# Patient Record
Sex: Male | Born: 1990 | Race: Black or African American | Hispanic: No | Marital: Single | Smoking: Current every day smoker
Health system: Southern US, Community
[De-identification: ages and names within clinical notes are randomized; demographics above are authoritative.]

---
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
## 2012-12-09 ENCOUNTER — Emergency Department (HOSPITAL_COMMUNITY): Payer: Medicaid Other

## 2012-12-09 ENCOUNTER — Encounter (HOSPITAL_COMMUNITY): Payer: Self-pay | Admitting: *Deleted

## 2012-12-09 ENCOUNTER — Telehealth (HOSPITAL_COMMUNITY): Payer: Self-pay | Admitting: Emergency Medicine

## 2012-12-09 ENCOUNTER — Emergency Department (HOSPITAL_COMMUNITY)
Admission: EM | Admit: 2012-12-09 | Discharge: 2012-12-09 | Disposition: A | Discharge status: Home / Self Care | Payer: Medicaid Other | Attending: Emergency Medicine | Admitting: Emergency Medicine

## 2012-12-09 DIAGNOSIS — Y929 Unspecified place or not applicable: Secondary | ICD-10-CM | POA: Insufficient documentation

## 2012-12-09 DIAGNOSIS — S41109A Unspecified open wound of unspecified upper arm, initial encounter: Secondary | ICD-10-CM | POA: Insufficient documentation

## 2012-12-09 DIAGNOSIS — W3400XA Accidental discharge from unspecified firearms or gun, initial encounter: Secondary | ICD-10-CM | POA: Insufficient documentation

## 2012-12-09 DIAGNOSIS — S41132A Puncture wound without foreign body of left upper arm, initial encounter: Secondary | ICD-10-CM

## 2012-12-09 DIAGNOSIS — Y939 Activity, unspecified: Secondary | ICD-10-CM | POA: Insufficient documentation

## 2012-12-09 LAB — CBC WITH DIFFERENTIAL/PLATELET
Basophils Absolute: 0 10*3/uL (ref 0.0–0.1)
Eosinophils Absolute: 0.2 10*3/uL (ref 0.0–0.7)
Eosinophils Relative: 4 % (ref 0–5)
Lymphs Abs: 2 10*3/uL (ref 0.7–4.0)
Monocytes Absolute: 0.4 10*3/uL (ref 0.1–1.0)
Monocytes Relative: 7 % (ref 3–12)
Neutro Abs: 3 10*3/uL (ref 1.7–7.7)
Neutrophils Relative %: 53 % (ref 43–77)
RBC: 4.64 MIL/uL (ref 4.22–5.81)
RDW: 13.1 % (ref 11.5–15.5)

## 2012-12-09 LAB — POCT I-STAT, CHEM 8
BUN: 8 mg/dL (ref 6–23)
Creatinine, Ser: 1.1 mg/dL (ref 0.50–1.35)
Glucose, Bld: 90 mg/dL (ref 70–99)
Hemoglobin: 15.3 g/dL (ref 13.0–17.0)
Potassium: 3.6 mEq/L (ref 3.5–5.1)
Sodium: 140 mEq/L (ref 135–145)

## 2012-12-09 MED ORDER — FENTANYL CITRATE 0.05 MG/ML IJ SOLN
100.0000 ug | Freq: Once | INTRAMUSCULAR | Status: AC
  Administered 2012-12-09: 100 ug via INTRAVENOUS
  Filled 2012-12-09: qty 2

## 2012-12-09 MED ORDER — CEPHALEXIN 500 MG PO CAPS: ORAL_CAPSULE | ORAL | Status: DC

## 2012-12-09 MED ORDER — HYDROCODONE-ACETAMINOPHEN 5-325 MG PO TABS: 2.0000 | ORAL_TABLET | Freq: Four times a day (QID) | ORAL | Status: DC | PRN

## 2012-12-09 MED ORDER — SODIUM CHLORIDE 0.9 % IV SOLN
INTRAVENOUS | Status: DC
  Administered 2012-12-09: 03:00:00 via INTRAVENOUS

## 2012-12-09 MED ORDER — CEFAZOLIN SODIUM 1-5 GM-% IV SOLN
1.0000 g | Freq: Once | INTRAVENOUS | Status: AC
  Administered 2012-12-09: 1 g via INTRAVENOUS
  Filled 2012-12-09: qty 50

## 2012-12-09 MED ORDER — SODIUM CHLORIDE 0.9 % IV BOLUS (SEPSIS)
500.0000 mL | Freq: Once | INTRAVENOUS | Status: AC
  Administered 2012-12-09: 500 mL via INTRAVENOUS
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
## 2012-12-10 ENCOUNTER — Telehealth (HOSPITAL_COMMUNITY): Payer: Self-pay | Admitting: Emergency Medicine

## 2012-12-18 ENCOUNTER — Encounter (INDEPENDENT_AMBULATORY_CARE_PROVIDER_SITE_OTHER): Payer: Self-pay

## 2012-12-18 ENCOUNTER — Ambulatory Visit (INDEPENDENT_AMBULATORY_CARE_PROVIDER_SITE_OTHER): Payer: Medicaid Other | Admitting: Internal Medicine

## 2012-12-18 VITALS — BP 120/80 | HR 76 | Resp 14 | Ht 67.0 in | Wt 159.6 lb

## 2012-12-18 DIAGNOSIS — M795 Residual foreign body in soft tissue: Secondary | ICD-10-CM

## 2012-12-18 DIAGNOSIS — S41132D Puncture wound without foreign body of left upper arm, subsequent encounter: Secondary | ICD-10-CM

## 2012-12-18 NOTE — Patient Instructions (Addendum)
Change dressing with antibacterial cream and dry dressing atleast twice a day Follow up next Friday for suture removal
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
## 2012-12-25 ENCOUNTER — Ambulatory Visit (INDEPENDENT_AMBULATORY_CARE_PROVIDER_SITE_OTHER): Payer: Medicaid Other | Admitting: General Surgery

## 2012-12-25 ENCOUNTER — Encounter (INDEPENDENT_AMBULATORY_CARE_PROVIDER_SITE_OTHER): Payer: Self-pay

## 2012-12-25 VITALS — BP 122/80 | HR 82 | Temp 98.2°F | Resp 15 | Ht 64.0 in | Wt 161.0 lb

## 2012-12-25 DIAGNOSIS — L089 Local infection of the skin and subcutaneous tissue, unspecified: Secondary | ICD-10-CM

## 2012-12-25 DIAGNOSIS — IMO0002 Reserved for concepts with insufficient information to code with codable children: Secondary | ICD-10-CM
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
## 2015-03-01 ENCOUNTER — Emergency Department (HOSPITAL_COMMUNITY)
Admission: EM | Admit: 2015-03-01 | Discharge: 2015-03-01 | Disposition: A | Discharge status: Home / Self Care | Payer: Medicaid Other | Attending: Emergency Medicine | Admitting: Emergency Medicine

## 2015-03-01 ENCOUNTER — Encounter (HOSPITAL_COMMUNITY): Payer: Self-pay | Admitting: Emergency Medicine

## 2015-03-01 DIAGNOSIS — J029 Acute pharyngitis, unspecified: Secondary | ICD-10-CM | POA: Insufficient documentation

## 2015-03-01 DIAGNOSIS — Z720 Tobacco use: Secondary | ICD-10-CM | POA: Insufficient documentation

## 2015-03-01 LAB — RAPID STREP SCREEN (MED CTR MEBANE ONLY): Streptococcus, Group A Screen (Direct): NEGATIVE

## 2015-03-01 MED ORDER — DEXAMETHASONE SODIUM PHOSPHATE 10 MG/ML IJ SOLN
10.0000 mg | Freq: Once | INTRAMUSCULAR | Status: AC
  Administered 2015-03-01: 10 mg via INTRAMUSCULAR
  Filled 2015-03-01: qty 1

## 2015-03-01 NOTE — Discharge Instructions (Signed)

## 2015-03-01 NOTE — ED Provider Notes (Signed)
CSN: [id number]     Arrival date & time 03/01/15  2203 History  By signing my name below, I, Lyndel SafeKaitlyn Shelton, attest that this documentation has been prepared under the direction and in the presence of Teressa LowerVrinda Samera Macy, NP. Electronically Signed: Lyndel SafeKaitlyn Shelton, ED Scribe. 03/01/2015. 10:34 PM.   Chief Complaint  Patient presents with  . Sore Throat   The history is provided by the patient. No language interpreter was used.   HPI Comments: Earl Reynolds is a 24 y.o. male, with no chronic medical conditions, who presents to the Emergency Department complaining of a progressively worsening, constant sore throat onset 2 days ago that is has been unrelieved by cough drops and OTC medication. He denies exposure to any chemicals, rhinorrhea or fevers; however, pt is febrile on triage vitals at 100.59F.   History reviewed. No pertinent past medical history. History reviewed. No pertinent past surgical history. No family history on file. Social History  Substance Use Topics  . Smoking status: Current Every Day Smoker -- 0.25 packs/day  . Smokeless tobacco: Never Used  . Alcohol Use: No    Review of Systems  Constitutional: Negative for fever.  HENT: Positive for sore throat. Negative for rhinorrhea.   All other systems reviewed and are negative.  Allergies  Review of patient's allergies indicates no known allergies.  Home Medications   Prior to Admission medications   Medication Sig Start Date End Date Taking? Authorizing Provider  cephALEXin (KEFLEX) 500 MG capsule 2 caps po bid x 7 days 12/09/12   Wayland SalinasJohn Bednar, MD  HYDROcodone-acetaminophen (NORCO) 5-325 MG per tablet Take 2 tablets by mouth every 6 (six) hours as needed for pain. 12/09/12   Wayland SalinasJohn Bednar, MD   BP 134/84 mmHg  Pulse 92  Temp(Src) 100.9 F (38.3 C) (Oral)  Resp 16  SpO2 98% Physical Exam  Constitutional: He is oriented to person, place, and time. He appears well-developed and well-nourished. No distress.  HENT:  Head:  Normocephalic.  Right Ear: External ear normal.  Left Ear: External ear normal.  Mouth/Throat: Oropharyngeal exudate and posterior oropharyngeal erythema present.  Eyes: Conjunctivae are normal.  Neck: Normal range of motion. Neck supple.  Cardiovascular: Normal rate.   Pulmonary/Chest: Effort normal. No respiratory distress.  Abdominal: Soft. Bowel sounds are normal. There is no tenderness.  Musculoskeletal: Normal range of motion.  Neurological: He is alert and oriented to person, place, and time. Coordination normal.  Skin: Skin is warm.  Psychiatric: He has a normal mood and affect. His behavior is normal.  Nursing note and vitals reviewed.   ED Course  Procedures  DIAGNOSTIC STUDIES: Oxygen Saturation is 98% on RA, normal by my interpretation.    COORDINATION OF CARE: 10:33 PM Discussed treatment plan with pt at bedside and pt agreed to plan.   Labs Review Labs Reviewed  RAPID STREP SCREEN (NOT AT [facility])  CULTURE, GROUP A STREP   I have personally reviewed and evaluated these lab results as part of my medical decision-making.   MDM   Final diagnoses:  Pharyngitis   Strep negative. Culture sent. No pta. Discussed symptomatic treatment at home  I personally performed the services described in this documentation, which was scribed in my presence. The recorded information has been reviewed and is accurate.    Teressa LowerVrinda Oleta Gunnoe, NP 03/01/15 2322  Cathren LaineKevin Steinl, MD 03/07/15 1047

## 2015-03-01 NOTE — ED Notes (Signed)
Pt. reports sore throat with swelling onset last week , denies cough / no fever or chills.

## 2015-03-04 LAB — CULTURE, GROUP A STREP

## 2016-04-28 ENCOUNTER — Encounter (HOSPITAL_COMMUNITY): Payer: Self-pay

## 2016-04-28 ENCOUNTER — Emergency Department (HOSPITAL_COMMUNITY)
Admission: EM | Admit: 2016-04-28 | Discharge: 2016-04-28 | Disposition: A | Discharge status: Home Health | Payer: Medicaid Other | Attending: Emergency Medicine | Admitting: Emergency Medicine

## 2016-04-28 DIAGNOSIS — Y9389 Activity, other specified: Secondary | ICD-10-CM | POA: Insufficient documentation

## 2016-04-28 DIAGNOSIS — F17200 Nicotine dependence, unspecified, uncomplicated: Secondary | ICD-10-CM | POA: Insufficient documentation

## 2016-04-28 DIAGNOSIS — Y929 Unspecified place or not applicable: Secondary | ICD-10-CM | POA: Insufficient documentation

## 2016-04-28 DIAGNOSIS — S41112A Laceration without foreign body of left upper arm, initial encounter: Secondary | ICD-10-CM

## 2016-04-28 DIAGNOSIS — Y999 Unspecified external cause status: Secondary | ICD-10-CM | POA: Insufficient documentation

## 2016-04-28 MED ORDER — LIDOCAINE HCL (PF) 1 % IJ SOLN
5.0000 mL | Freq: Once | INTRAMUSCULAR | Status: AC
  Administered 2016-04-28: 5 mL
  Filled 2016-04-28: qty 5

## 2016-04-28 MED ORDER — CEPHALEXIN 500 MG PO CAPS: 500.0000 mg | ORAL_CAPSULE | Freq: Four times a day (QID) | ORAL | 0 refills | Status: AC

## 2016-04-28 MED ORDER — CEPHALEXIN 250 MG PO CAPS
500.0000 mg | ORAL_CAPSULE | Freq: Once | ORAL | Status: AC
  Administered 2016-04-28: 500 mg via ORAL
  Filled 2016-04-28: qty 2

## 2016-04-28 MED ORDER — IBUPROFEN 800 MG PO TABS
800.0000 mg | ORAL_TABLET | Freq: Once | ORAL | Status: AC
  Administered 2016-04-28: 800 mg via ORAL
  Filled 2016-04-28: qty 1

## 2016-04-28 MED ORDER — NAPROXEN 500 MG PO TABS: 500.0000 mg | ORAL_TABLET | Freq: Two times a day (BID) | ORAL | 0 refills | Status: DC

## 2016-04-28 NOTE — Discharge Instructions (Signed)
Take the medication as directed follow up in 7 to 10 days for suture removal. Return here sooner for any problems.
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
## 2016-11-30 ENCOUNTER — Encounter (HOSPITAL_COMMUNITY): Payer: Self-pay | Admitting: Emergency Medicine

## 2016-11-30 ENCOUNTER — Emergency Department (HOSPITAL_COMMUNITY)
Admission: EM | Admit: 2016-11-30 | Discharge: 2016-11-30 | Disposition: A | Discharge status: Home / Self Care | Payer: Medicaid Other | Attending: Emergency Medicine | Admitting: Emergency Medicine

## 2016-11-30 DIAGNOSIS — J029 Acute pharyngitis, unspecified: Secondary | ICD-10-CM

## 2016-11-30 DIAGNOSIS — J36 Peritonsillar abscess: Secondary | ICD-10-CM | POA: Insufficient documentation

## 2016-11-30 DIAGNOSIS — F17200 Nicotine dependence, unspecified, uncomplicated: Secondary | ICD-10-CM | POA: Insufficient documentation

## 2016-11-30 LAB — RAPID STREP SCREEN (MED CTR MEBANE ONLY): STREPTOCOCCUS, GROUP A SCREEN (DIRECT): NEGATIVE

## 2016-11-30 MED ORDER — HYDROCODONE-ACETAMINOPHEN 7.5-325 MG/15ML PO SOLN: 10.0000 mL | Freq: Four times a day (QID) | ORAL | 0 refills | Status: AC | PRN

## 2016-11-30 MED ORDER — CLINDAMYCIN HCL 150 MG PO CAPS: 450.0000 mg | ORAL_CAPSULE | Freq: Three times a day (TID) | ORAL | 0 refills | Status: AC

## 2016-11-30 MED ORDER — HYDROCODONE-ACETAMINOPHEN 7.5-325 MG/15ML PO SOLN
10.0000 mL | Freq: Once | ORAL | Status: AC
  Administered 2016-11-30: 10 mL via ORAL
  Filled 2016-11-30: qty 15

## 2016-11-30 MED ORDER — PREDNISONE 20 MG PO TABS: 40.0000 mg | ORAL_TABLET | Freq: Every day | ORAL | 0 refills | Status: AC

## 2016-11-30 MED ORDER — CLINDAMYCIN HCL 150 MG PO CAPS
450.0000 mg | ORAL_CAPSULE | Freq: Once | ORAL | Status: AC
  Administered 2016-11-30: 450 mg via ORAL
  Filled 2016-11-30: qty 3

## 2016-11-30 MED ORDER — PREDNISONE 20 MG PO TABS
60.0000 mg | ORAL_TABLET | Freq: Once | ORAL | Status: AC
  Administered 2016-11-30: 60 mg via ORAL
  Filled 2016-11-30: qty 3

## 2016-11-30 NOTE — ED Notes (Signed)
ED Provider at bedside. 

## 2016-11-30 NOTE — ED Triage Notes (Signed)
Sore throat since last night.  

## 2016-11-30 NOTE — ED Provider Notes (Signed)
[facility]-EMERGENCY DEPT Provider Note   CSN: [id number] Arrival date & time: 11/30/16  1302   By signing my name below, I, Earl Reynolds, attest that this documentation has been prepared under the direction and in the presence of Earl HartKelly Wafa Martes, PA-C. Electronically signed, Earl Reynolds, ED Scribe. 11/30/16. 2:13 PM.  History   Chief Complaint Chief Complaint  Patient presents with  . Sore Throat   The history is provided by the patient and medical records. No language interpreter was used.    Earl Reynolds is a [age] y.o. male presenting to the Emergency Department concerning sudden R sided sore throat onset last night. Associated difficulty swallowing, subjective fever, fatigue, throat swelling sensation, R sided adenopathy and difficulty tolerating secretions. 6/10, constant soreness described. He notes sick contacts recently. Pt denies drooling, stating he "spits it out", referring to his oral secretions. No congestion, cough, rhinorrhea, postnasal drainage, headache, ear pain, eye pain or pain elsewhere. No other complaints at this time.   History reviewed. No pertinent past medical history.  There are no active problems to display for this patient.   History reviewed. No pertinent surgical history.     Home Medications    Prior to Admission medications   Medication Sig Start Date End Date Taking? Authorizing Provider  cephALEXin (KEFLEX) 500 MG capsule Take 1 capsule (500 mg total) by mouth 4 (four) times daily. 04/28/16   Earl Reynolds, Earl M, NP  naproxen (NAPROSYN) 500 MG tablet Take 1 tablet (500 mg total) by mouth 2 (two) times daily. 04/28/16   Earl Reynolds, Earl M, NP    Family History No family history on file.  Social History Social History  Substance Use Topics  . Smoking status: Current Every Day Smoker    Packs/day: 0.25  . Smokeless tobacco: Never Used  . Alcohol use No     Allergies   Patient has no known allergies.   Review of Systems Review of Systems    Constitutional: Positive for chills and fever. Negative for diaphoresis.  HENT: Positive for sore throat and trouble swallowing. Negative for congestion, drooling, ear pain, postnasal drip, rhinorrhea, sinus pain and sinus pressure.   Eyes: Negative for pain.  Cardiovascular: Negative for chest pain.  Gastrointestinal: Negative for abdominal pain, nausea and vomiting.  Musculoskeletal: Negative for arthralgias and myalgias.  Neurological: Negative for headaches.  Hematological: Positive for adenopathy.     Physical Exam Updated Vital Signs BP (!) 147/86   Temp 99.5 F (37.5 C) (Oral)   Resp 18   SpO2 98%   Physical Exam  Constitutional: He is oriented to person, place, and time. He appears well-developed and well-nourished.  uncomfortable appearing.  HENT:  Head: Normocephalic.  Mouth/Throat: Uvula is midline. Posterior oropharyngeal edema, posterior oropharyngeal erythema and tonsillar abscesses present.  Swelling and erythema to R oropharynx  Eyes: EOM are normal.  Neck: Normal range of motion.  Cardiovascular: Normal rate and regular rhythm.  Exam reveals no gallop and no friction rub.   No murmur heard. Pulmonary/Chest: Effort normal and breath sounds normal. No respiratory distress. He has no wheezes. He has no rales. He exhibits no tenderness.  Abdominal: He exhibits no distension.  Musculoskeletal: Normal range of motion.  Neurological: He is alert and oriented to person, place, and time.  Psychiatric: He has a normal mood and affect.  Nursing note and vitals reviewed.    ED Treatments / Results  DIAGNOSTIC STUDIES: Oxygen Saturation is 98% on RA, NL by my interpretation.    COORDINATION OF  CARE: 2:10 PM-Discussed next steps with pt. Pt verbalized understanding and is agreeable with the plan. Will order medications and consult attending.   Labs (all labs ordered are listed, but only abnormal results are displayed) Labs Reviewed  RAPID STREP SCREEN (NOT AT  [facility])    EKG  EKG Interpretation None       Radiology No results found.  Procedures Procedures (including critical care time)  Medications Ordered in ED Medications - No data to display   Initial Impression / Assessment and Plan / ED Course  I have reviewed the triage vital signs and the nursing notes.  Pertinent labs & imaging results that were available during my care of the patient were reviewed by me and considered in my medical decision making (see chart for details).  26 year old male presents with PTA. Vitals are normal. He is handling secretions. He has marked edema and it appears to be more enlarged on R than L. Shared visit with Dr. Rosalia Hammersay. Will tx with Clinda, steroids, pain medicine and gave strict return precautions. ENT f/u given.  Final Clinical Impressions(s) / ED Diagnoses   Final diagnoses:  Acute pharyngitis, unspecified etiology  Peritonsillar abscess    New Prescriptions New Prescriptions   No medications on file   I personally performed the services described in this documentation, which was scribed in my presence. The recorded information has been reviewed and is accurate.    Earl Reynolds, Earl Reynolds Marie, PA-C 11/30/16 1745    Earl Reynolds, Danielle, Reynolds 12/07/16 [phone]

## 2016-11-30 NOTE — Discharge Instructions (Signed)
Take antibiotic three times a day for a week Take steroid once a day Take pain medicine as needed. Do not drive or drink alcohol while taking this medicine Follow up with ENT Return if worsening

## 2016-12-02 LAB — CULTURE, GROUP A STREP (THRC)

## 2017-08-31 ENCOUNTER — Encounter (HOSPITAL_COMMUNITY): Payer: Self-pay

## 2017-08-31 ENCOUNTER — Emergency Department (HOSPITAL_COMMUNITY)
Admission: EM | Admit: 2017-08-31 | Discharge: 2017-08-31 | Disposition: A | Discharge status: Home / Self Care | Payer: Self-pay | Attending: Emergency Medicine | Admitting: Emergency Medicine

## 2017-08-31 ENCOUNTER — Other Ambulatory Visit: Payer: Self-pay

## 2017-08-31 DIAGNOSIS — Y929 Unspecified place or not applicable: Secondary | ICD-10-CM | POA: Insufficient documentation

## 2017-08-31 DIAGNOSIS — Y939 Activity, unspecified: Secondary | ICD-10-CM | POA: Insufficient documentation

## 2017-08-31 DIAGNOSIS — S51812A Laceration without foreign body of left forearm, initial encounter: Secondary | ICD-10-CM | POA: Insufficient documentation

## 2017-08-31 DIAGNOSIS — F17210 Nicotine dependence, cigarettes, uncomplicated: Secondary | ICD-10-CM | POA: Insufficient documentation

## 2017-08-31 DIAGNOSIS — Y999 Unspecified external cause status: Secondary | ICD-10-CM | POA: Insufficient documentation

## 2017-08-31 MED ORDER — LIDOCAINE-EPINEPHRINE (PF) 2 %-1:200000 IJ SOLN
10.0000 mL | Freq: Once | INTRAMUSCULAR | Status: AC
  Administered 2017-08-31: 10 mL
  Filled 2017-08-31: qty 20
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
## 2017-08-31 NOTE — ED Notes (Signed)
Laceration cleaned with saline, covered with nonstick guaze and wrapped while pt waiting on an EDP.

## 2017-08-31 NOTE — Discharge Instructions (Addendum)
Suture removal in 7 days.  Clean wond daily with soamp and water, apply antibiotic ointment, and keep covered.
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
## 2018-03-16 ENCOUNTER — Emergency Department (HOSPITAL_COMMUNITY)
Admission: EM | Admit: 2018-03-16 | Discharge: 2018-03-16 | Disposition: A | Discharge status: Home / Self Care | Payer: Self-pay | Attending: Emergency Medicine | Admitting: Emergency Medicine

## 2018-03-16 ENCOUNTER — Encounter (HOSPITAL_COMMUNITY): Payer: Self-pay

## 2018-03-16 ENCOUNTER — Other Ambulatory Visit: Payer: Self-pay

## 2018-03-16 DIAGNOSIS — F17210 Nicotine dependence, cigarettes, uncomplicated: Secondary | ICD-10-CM | POA: Insufficient documentation

## 2018-03-16 DIAGNOSIS — A084 Viral intestinal infection, unspecified: Secondary | ICD-10-CM | POA: Insufficient documentation

## 2018-03-16 MED ORDER — ONDANSETRON HCL 4 MG PO TABS: 4.0000 mg | ORAL_TABLET | Freq: Four times a day (QID) | ORAL | 0 refills | Status: AC

## 2018-03-16 MED ORDER — ONDANSETRON 4 MG PO TBDP
4.0000 mg | ORAL_TABLET | Freq: Once | ORAL | Status: AC
  Administered 2018-03-16: 4 mg via ORAL
  Filled 2018-03-16: qty 1

## 2018-03-16 MED ORDER — ONDANSETRON HCL 4 MG PO TABS: 4.0000 mg | ORAL_TABLET | Freq: Four times a day (QID) | ORAL | 0 refills | Status: DC

## 2018-03-16 NOTE — ED Notes (Signed)
Patient verbalizes understanding of discharge instructions. Opportunity for questioning and answers were provided. Armband removed by staff, pt discharged from ED.  

## 2018-03-16 NOTE — Discharge Instructions (Addendum)
Please read attached information. If you experience any new or worsening signs or symptoms please return to the emergency room for evaluation. Please follow-up with your primary care provider or specialist as discussed. Please use medication prescribed only as directed and discontinue taking if you have any concerning signs or symptoms.   °

## 2018-03-16 NOTE — ED Provider Notes (Signed)
[facility] EMERGENCY DEPARTMENT Provider Note   CSN: [id number] Arrival date & time: 03/16/18  1212    History   Chief Complaint Chief Complaint  Patient presents with  . Headache  . Nausea    HPI Earl Reynolds is a 27 y.o. male.  HPI   27 year old male presents today with complaints of nausea vomiting and headache.  Patient notes symptoms started yesterday on his way to work.  He notes a generalized throbbing headache with no associated neurological deficits or neck stiffness.  He notes several episodes of vomiting.  He has been tolerating p.o. but continues to be nauseous.  Patient denies any abdominal pain diarrhea.  Patient notes feeling hot but no objective fever at home.  He notes his mother is sick with similar symptoms.  He denies any abnormal exposures, denies any insect bites.  He notes taking Tylenol yesterday, no medications prior to arrival today.   History reviewed. No pertinent past medical history.  There are no active problems to display for this patient.   History reviewed. No pertinent surgical history.     Home Medications    Prior to Admission medications   Medication Sig Start Date End Date Taking? Authorizing Provider  cephALEXin (KEFLEX) 500 MG capsule Take 1 capsule (500 mg total) by mouth 4 (four) times daily. 04/28/16   Janne Napoleon, NP  clindamycin (CLEOCIN) 150 MG capsule Take 3 capsules (450 mg total) by mouth 3 (three) times daily. 11/30/16   Bethel Born, PA-C  HYDROcodone-acetaminophen (HYCET) 7.5-325 mg/15 ml solution Take 10 mLs by mouth 4 (four) times daily as needed for moderate pain. Patient not taking: Reported on 08/31/2017 11/30/16   Bethel Born, PA-C  ondansetron (ZOFRAN) 4 MG tablet Take 1 tablet (4 mg total) by mouth every 6 (six) hours. 03/16/18   Orton Capell, Tinnie Gens, PA-C  predniSONE (DELTASONE) 20 MG tablet Take 2 tablets (40 mg total) by mouth daily. 11/30/16   Bethel Born, PA-C    Family  History No family history on file.  Social History Social History   Tobacco Use  . Smoking status: Current Every Day Smoker    Packs/day: 0.25  . Smokeless tobacco: Never Used  Substance Use Topics  . Alcohol use: No  . Drug use: No     Allergies   Patient has no known allergies.   Review of Systems Review of Systems  All other systems reviewed and are negative.  Physical Exam Updated Vital Signs BP (!) 148/94 (BP Location: Right Arm)   Pulse 90   Temp 98.3 F (36.8 C) (Oral)   Resp 16   Ht 5\' 6"  (1.676 m)   Wt 86.2 kg   SpO2 99%   BMI 30.67 kg/m   Physical Exam  Constitutional: He is oriented to person, place, and time. He appears well-developed and well-nourished.  HENT:  Head: Normocephalic and atraumatic.  Oropharynx clear no swelling or edema  Eyes: Pupils are equal, round, and reactive to light. Conjunctivae are normal. Right eye exhibits no discharge. Left eye exhibits no discharge. No scleral icterus.  Neck: Normal range of motion. No JVD present. No tracheal deviation present.  Pulmonary/Chest: Effort normal. No stridor.  Abdominal:  Abdomen soft nontender  Neurological: He is alert and oriented to person, place, and time. Coordination normal.  Psychiatric: He has a normal mood and affect. His behavior is normal. Judgment and thought content normal.  Nursing note and vitals reviewed.  ED Treatments / Results  Labs (all labs ordered are listed, but only abnormal results are displayed) Labs Reviewed - No data to display  EKG None  Radiology No results found.  Procedures Procedures (including critical care time)  Medications Ordered in ED Medications  ondansetron (ZOFRAN-ODT) disintegrating tablet 4 mg (4 mg Oral Given 03/16/18 1250)     Initial Impression / Assessment and Plan / ED Course  I have reviewed the triage vital signs and the nursing notes.  Pertinent labs & imaging results that were available during my care of the patient  were reviewed by me and considered in my medical decision making (see chart for details).     Labs:   Imaging:  Consults:  Therapeutics: Zofran  Discharge Meds: Zofran  Assessment/Plan: 27 year old male presents today with likely viral illness.  He is well-appearing in no acute distress soft nontender abdomen.  He is afebrile.  Tolerating p.o. discharged with Zofran, strict return precautions.  Patient verbalized understanding and agreement to today's plan.   Final Clinical Impressions(s) / ED Diagnoses   Final diagnoses:  Viral gastroenteritis    ED Discharge Orders         Ordered    ondansetron (ZOFRAN) 4 MG tablet  Every 6 hours,   Status:  Discontinued     03/16/18 1320    ondansetron (ZOFRAN) 4 MG tablet  Every 6 hours     03/16/18 1320           Eyvonne Mechanic, PA-C 03/16/18 1320    Cathren Laine, MD 03/17/18 [phone]

## 2018-03-16 NOTE — ED Triage Notes (Signed)
Pt reports headache, nausea, vomiting x1 last night and sweating. Reports sick contacts

## 2019-05-30 ENCOUNTER — Encounter (HOSPITAL_COMMUNITY): Payer: Self-pay

## 2019-05-30 ENCOUNTER — Other Ambulatory Visit: Payer: Self-pay

## 2019-05-30 ENCOUNTER — Ambulatory Visit (HOSPITAL_COMMUNITY)
Admission: EM | Admit: 2019-05-30 | Discharge: 2019-05-30 | Disposition: A | Discharge status: Home / Self Care | Payer: Self-pay | Attending: Family Medicine | Admitting: Family Medicine

## 2019-05-30 ENCOUNTER — Ambulatory Visit (INDEPENDENT_AMBULATORY_CARE_PROVIDER_SITE_OTHER): Payer: Self-pay

## 2019-05-30 DIAGNOSIS — M25562 Pain in left knee: Secondary | ICD-10-CM

## 2019-05-30 MED ORDER — CYCLOBENZAPRINE HCL 10 MG PO TABS: 10.0000 mg | ORAL_TABLET | Freq: Two times a day (BID) | ORAL | 0 refills | Status: AC | PRN
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
## 2020-10-10 ENCOUNTER — Encounter (HOSPITAL_COMMUNITY): Payer: Self-pay | Admitting: Emergency Medicine

## 2020-10-10 ENCOUNTER — Other Ambulatory Visit: Payer: Self-pay

## 2020-10-10 ENCOUNTER — Ambulatory Visit (HOSPITAL_COMMUNITY)
Admission: EM | Admit: 2020-10-10 | Discharge: 2020-10-10 | Disposition: A | Discharge status: Home / Self Care | Payer: Self-pay

## 2020-10-10 DIAGNOSIS — S299XXA Unspecified injury of thorax, initial encounter: Secondary | ICD-10-CM

## 2020-10-10 DIAGNOSIS — W19XXXA Unspecified fall, initial encounter: Secondary | ICD-10-CM

## 2020-10-10 NOTE — Discharge Instructions (Signed)
You can take Tylenol and/or Ibuprofen as needed for pain relief.  You can continue to take the muscle relaxer that you got over-the-counter.    Take epsom salt baths or apply a warm compress a few times a day for comfort.    Brace your ribs when coughing or taking a deep breath.  Make sure that you are taking deep breaths multiple times a day.    Drink plenty of fluids, rest, and do gentle stretches/exercises.    Return or go to the Emergency Department if symptoms worsen or do not improve in the next few days.

## 2020-10-10 NOTE — ED Triage Notes (Signed)
Patient reports missing a step going down steps and fell.  Patient reports falling down 6-7.  Patient reports incident 2 days ago.  Patient is having right back pain, sharp pain.  Coughing, sneezing causes pain in right back/rib cage area

## 2020-10-10 NOTE — ED Provider Notes (Signed)
[facility]    CSN: [id number] Arrival date & time: 10/10/20  1049      History   Chief Complaint Chief Complaint  Patient presents with  . Fall    HPI Earl Reynolds is a 30 y.o. male.   Patient here for evaluation of right rib and back pain after falling down stairs approximately 2 days ago.  Reports pain worse with coughing, sneezing, or taking a deep breath.  Reports taking OTC muscle relaxant and Epson salt baths for pain relief.  Denies any loss of consciousness or head trauma.  Denies any fevers, chest pain, shortness of breath, N/V/D, numbness, tingling, weakness, abdominal pain, or headaches.    The history is provided by the patient.  Fall    History reviewed. No pertinent past medical history.  There are no problems to display for this patient.   History reviewed. No pertinent surgical history.     Home Medications    Prior to Admission medications   Medication Sig Start Date End Date Taking? Authorizing Provider  cephALEXin (KEFLEX) 500 MG capsule Take 1 capsule (500 mg total) by mouth 4 (four) times daily. Patient not taking: Reported on 10/10/2020 04/28/16   Janne Napoleon, NP  clindamycin (CLEOCIN) 150 MG capsule Take 3 capsules (450 mg total) by mouth 3 (three) times daily. Patient not taking: Reported on 10/10/2020 11/30/16   Bethel Born, PA-C  cyclobenzaprine (FLEXERIL) 10 MG tablet Take 1 tablet (10 mg total) by mouth 2 (two) times daily as needed for muscle spasms. Patient not taking: Reported on 10/10/2020 05/30/19   Frederica Kuster, MD  HYDROcodone-acetaminophen (HYCET) 7.5-325 mg/15 ml solution Take 10 mLs by mouth 4 (four) times daily as needed for moderate pain. Patient not taking: Reported on 08/31/2017 11/30/16   Bethel Born, PA-C  ondansetron (ZOFRAN) 4 MG tablet Take 1 tablet (4 mg total) by mouth every 6 (six) hours. Patient not taking: Reported on 10/10/2020 03/16/18   Hedges, Tinnie Gens, PA-C  predniSONE (DELTASONE) 20 MG  tablet Take 2 tablets (40 mg total) by mouth daily. Patient not taking: Reported on 10/10/2020 11/30/16   Bethel Born, PA-C    Family History Family History  Problem Relation Age of Onset  . Healthy Mother   . Healthy Father     Social History Social History   Tobacco Use  . Smoking status: Current Every Day Smoker    Packs/day: 0.25  . Smokeless tobacco: Never Used  Vaping Use  . Vaping Use: Never used  Substance Use Topics  . Alcohol use: Yes    Comment: rare  . Drug use: No     Allergies   Patient has no known allergies.   Review of Systems Review of Systems  Musculoskeletal: Positive for back pain and myalgias.  All other systems reviewed and are negative.    Physical Exam Triage Vital Signs ED Triage Vitals  Enc Vitals Group     BP 10/10/20 1114 132/81     Pulse Rate 10/10/20 1114 72     Resp 10/10/20 1114 18     Temp 10/10/20 1114 98.3 F (36.8 C)     Temp Source 10/10/20 1114 Oral     SpO2 10/10/20 1114 99 %     Weight --      Height --      Head Circumference --      Peak Flow --      Pain Score 10/10/20 1112 8  Pain Loc --      Pain Edu? --      Excl. in GC? --    No data found.  Updated Vital Signs BP 132/81 (BP Location: Right Arm)   Pulse 72   Temp 98.3 F (36.8 C) (Oral)   Resp 18   SpO2 99%   Visual Acuity Right Eye Distance:   Left Eye Distance:   Bilateral Distance:    Right Eye Near:   Left Eye Near:    Bilateral Near:     Physical Exam Vitals and nursing note reviewed.  Constitutional:      General: He is not in acute distress.    Appearance: Normal appearance. He is not ill-appearing, toxic-appearing or diaphoretic.  HENT:     Head: Normocephalic and atraumatic.  Eyes:     Conjunctiva/sclera: Conjunctivae normal.  Cardiovascular:     Rate and Rhythm: Normal rate.     Pulses: Normal pulses.  Pulmonary:     Effort: Pulmonary effort is normal.  Abdominal:     General: Abdomen is flat.  Musculoskeletal:         General: Normal range of motion.     Cervical back: Normal and normal range of motion.     Thoracic back: Tenderness (right side and ribs) present. No deformity or bony tenderness. Normal range of motion.     Lumbar back: Normal.  Skin:    General: Skin is warm and dry.  Neurological:     General: No focal deficit present.     Mental Status: He is alert and oriented to person, place, and time.  Psychiatric:        Mood and Affect: Mood normal.      UC Treatments / Results  Labs (all labs ordered are listed, but only abnormal results are displayed) Labs Reviewed - No data to display  EKG   Radiology No results found.  Procedures Procedures (including critical care time)  Medications Ordered in UC Medications - No data to display  Initial Impression / Assessment and Plan / UC Course  I have reviewed the triage vital signs and the nursing notes.  Pertinent labs & imaging results that were available during my care of the patient were reviewed by me and considered in my medical decision making (see chart for details).    Assessment negative for red flags or concerns.  May take Tylenol and/or Ibuprofen as needed for pain and may continue OTC muscle relaxant.  Continue warm compresses or epsom salt baths.  Encourage cough and deep breathing multiple times a day and brace ribs as needed for comfort.  Encouraged fluids and rest with gentle stretching.  Follow up with primary care as needed.  Final Clinical Impressions(s) / UC Diagnoses   Final diagnoses:  Rib injury  Fall, initial encounter     Discharge Instructions     You can take Tylenol and/or Ibuprofen as needed for pain relief.  You can continue to take the muscle relaxer that you got over-the-counter.    Take epsom salt baths or apply a warm compress a few times a day for comfort.    Brace your ribs when coughing or taking a deep breath.  Make sure that you are taking deep breaths multiple times a day.     Drink plenty of fluids, rest, and do gentle stretches/exercises.    Return or go to the Emergency Department if symptoms worsen or do not improve in the next few days.  ED Prescriptions    None     PDMP not reviewed this encounter.   Ivette Loyal, NP 10/10/20 1146
